# Patient Record
Sex: Female | Born: 1995 | Race: White | Hispanic: No | Marital: Single | Smoking: Never smoker
Health system: Southern US, Community
[De-identification: ages and names within clinical notes are randomized; demographics above are authoritative.]

## PROBLEM LIST (undated history)

## (undated) DIAGNOSIS — J020 Streptococcal pharyngitis: Secondary | ICD-10-CM

## (undated) DIAGNOSIS — R630 Anorexia: Secondary | ICD-10-CM

## (undated) DIAGNOSIS — R109 Unspecified abdominal pain: Secondary | ICD-10-CM

## (undated) DIAGNOSIS — G629 Polyneuropathy, unspecified: Secondary | ICD-10-CM

## (undated) HISTORY — PX: HYMENECTOMY: SHX987

## (undated) HISTORY — DX: Anorexia: R63.0

## (undated) HISTORY — DX: Streptococcal pharyngitis: J02.0

## (undated) HISTORY — PX: TONSILLECTOMY: SUR1361

## (undated) HISTORY — DX: Unspecified abdominal pain: R10.9

---
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
## 2021-02-26 ENCOUNTER — Other Ambulatory Visit: Payer: Self-pay

## 2021-02-26 ENCOUNTER — Ambulatory Visit (INDEPENDENT_AMBULATORY_CARE_PROVIDER_SITE_OTHER): Payer: 59 | Admitting: Internal Medicine

## 2021-02-26 ENCOUNTER — Telehealth: Payer: Self-pay | Admitting: *Deleted

## 2021-02-26 ENCOUNTER — Encounter: Payer: Self-pay | Admitting: Internal Medicine

## 2021-02-26 VITALS — BP 111/76 | HR 90 | Temp 97.1°F | Ht 65.0 in | Wt 139.2 lb

## 2021-02-26 DIAGNOSIS — K5900 Constipation, unspecified: Secondary | ICD-10-CM

## 2021-02-26 DIAGNOSIS — R1032 Left lower quadrant pain: Secondary | ICD-10-CM | POA: Diagnosis not present

## 2021-02-26 MED ORDER — CLENPIQ 10-3.5-12 MG-GM -GM/160ML PO SOLN: 1.0000 | Freq: Once | ORAL | 0 refills | Status: AC

## 2021-02-26 NOTE — H&P (View-Only) (Signed)
  Primary Care Physician:  Howard, Kevin, MD Primary Gastroenterologist:  Dr. Lovelee Forner  Chief Complaint  Patient presents with  . Abdominal Pain    Lower abd pain, constant. CT showed stool consistent with constipation. Reports BM's 1-2 times per day    HPI:   Kristin Fischer is a 24 y.o. female who presents to the clinic today by referral from her PCP Dr. Howard for evaluation.  Patient states approximately 3 weeks ago she had sudden onset severe left lower quadrant abdominal pain.  Rates as a 10 out of 10, nonradiating.  She presented to the ER at [facility] and had a CT scan without contrast which showed constipation, moderate stool burden.  She was sent home on mag citrate.  She states she drank all this and immediately vomited it back up.  She then tried enemas and stool softeners, none of which helped.  Since that time she continues to have left lower quadrant pain every day.  This is worse after she eats meals.  She states her appetite has declined severely.  Has not eaten or drink anything today.  Does note taking fiber in the past as well as drinking plenty water throughout the day.  No melena hematochezia.  No previous colonoscopy.  Does note a great grandparent with colon cancer.  No upper GI symptoms.  Denies any heartburn or reflux.  No dysphagia odynophagia.  No chronic NSAID use.  From a gynecological standpoint, patient states that she previously had severe menstrual cycles but she is now currently taking OCP and this is improved.  She no longer has monthly menstrual periods.  That being said she is being evaluated by gynecology next week for her left lower quadrant pain.  Past Medical History:  Diagnosis Date  . Abdominal pain, recurrent   . Poor appetite   . Strep pharyngitis    several times in the past prompting T&A; last episode 2014    Past Surgical History:  Procedure Laterality Date  . CHOLECYSTECTOMY, LAPAROSCOPIC  04/05/11   No Stones ID'd, EF= 7%  .  TONSILLECTOMY      Current Outpatient Medications  Medication Sig Dispense Refill  . DULoxetine (CYMBALTA) 20 MG capsule TAKE 1 CAPSULE (20 MG TOTAL) BY MOUTH DAILY. (Patient taking differently: Take 60 mg by mouth daily.) 30 capsule 3  . Multiple Vitamin (MULTIVITAMIN) tablet Take 1 tablet by mouth daily.    . NORTREL 0.5/35, 28, 0.5-35 MG-MCG tablet Take 1 tablet by mouth daily.     No current facility-administered medications for this visit.    Allergies as of 02/26/2021 - Review Complete 02/26/2021  Allergen Reaction Noted  . Cephalosporins  06/24/2011  . Lorabid [loracarbef]  06/24/2011  . Penicillins  06/24/2011  . Iohexol Hives 03/31/2011    Family History  Problem Relation Age of Onset  . Cholelithiasis Father     Social History   Socioeconomic History  . Marital status: Single    Spouse name: Not on file  . Number of children: Not on file  . Years of education: Not on file  . Highest education level: Not on file  Occupational History  . Not on file  Tobacco Use  . Smoking status: Never Smoker  . Smokeless tobacco: Never Used  Substance and Sexual Activity  . Alcohol use: Not on file  . Drug use: Not on file  . Sexual activity: Not on file  Other Topics Concern  . Not on file  Social History Narrative  .   Not on file   Social Determinants of Health   Financial Resource Strain: Not on file  Food Insecurity: Not on file  Transportation Needs: Not on file  Physical Activity: Not on file  Stress: Not on file  Social Connections: Not on file  Intimate Partner Violence: Not on file    Subjective: Review of Systems  Constitutional: Negative for chills and fever.  HENT: Negative for congestion and hearing loss.   Eyes: Negative for blurred vision and double vision.  Respiratory: Negative for cough and shortness of breath.   Cardiovascular: Negative for chest pain and palpitations.  Gastrointestinal: Positive for abdominal pain and constipation. Negative  for blood in stool, diarrhea, heartburn, melena and vomiting.  Genitourinary: Negative for dysuria and urgency.  Musculoskeletal: Negative for joint pain and myalgias.  Skin: Negative for itching and rash.  Neurological: Negative for dizziness and headaches.  Psychiatric/Behavioral: Negative for depression. The patient is not nervous/anxious.        Objective: BP 111/76   Pulse 90   Temp (!) 97.1 F (36.2 C)   Ht 5\' 5"  (1.651 m)   Wt 139 lb 3.2 oz (63.1 kg)   BMI 23.16 kg/m  Physical Exam Constitutional:      Appearance: Normal appearance.  HENT:     Head: Normocephalic and atraumatic.  Eyes:     Extraocular Movements: Extraocular movements intact.     Conjunctiva/sclera: Conjunctivae normal.  Cardiovascular:     Rate and Rhythm: Normal rate and regular rhythm.  Pulmonary:     Effort: Pulmonary effort is normal.     Breath sounds: Normal breath sounds.  Abdominal:     General: Bowel sounds are normal.     Palpations: Abdomen is soft.     Tenderness: There is abdominal tenderness in the left upper quadrant.  Musculoskeletal:        General: No swelling. Normal range of motion.     Cervical back: Normal range of motion and neck supple.  Skin:    General: Skin is warm and dry.     Coloration: Skin is not jaundiced.  Neurological:     General: No focal deficit present.     Mental Status: She is alert and oriented to person, place, and time.  Psychiatric:        Mood and Affect: Mood normal.        Behavior: Behavior normal.      Assessment: *Constipation *Left lower quadrant pain  Plan: Etiology of patient's left lower quadrant pain unclear.  We will schedule her for colonoscopy today to further evaluate for underlying phonatory bowel disease such as Crohn's disease ulcerative colitis, polyps, malignancy, stricturing, or other.  If colonoscopy unremarkable, we may need to consider repeat CT imaging with contrast as her CT scan in ER was without contrast looking  for kidney stones.  I am going to give her samples of Linzess 72 mcg daily.  She can increase to 2 tablets daily if she needs to.  Patient to call office if symptoms improved and I will send in a formal prescription.  I agree with gynecological evaluation which she is having done next week.  Further recommendations to follow.  Thank you Dr. for the kind referral  02/26/2021 12:05 PM   Disclaimer: This note was dictated with voice recognition software. Similar sounding words can inadvertently be transcribed and may not be corrected upon review.

## 2021-02-26 NOTE — Patient Instructions (Signed)
We will schedule you for colonoscopy to further evaluate your left lower quadrant abdominal pain.  We may need to consider repeat CT imaging with contrast depending on colonoscopy results.  I am going to give you samples of Linzess 72 mcg daily for chronic constipation.  I would take this medication in the morning.  You may have an initial washout period the first few days but this should improve.  If 72 mcg is not enough, you can take 2 pills every morning and see how you do.  If you would like me to send in a formal prescription for this medication, please give our office a call and I will do so.  I agree with gynecological evaluation as well.   At [facility] we value your feedback. You may receive a survey about your visit today. Please share your experience as we strive to create trusting relationships with our patients to provide genuine, compassionate, quality care.  We appreciate your understanding and patience as we review any laboratory studies, imaging, and other diagnostic tests that are ordered as we care for you. Our office policy is 5 business days for review of these results, and any emergent or urgent results are addressed in a timely manner for your best interest. If you do not hear from our office in 1 week, please contact us.   We also encourage the use of MyChart, which contains your medical information for your review as well. If you are not enrolled in this feature, an access code is on this after visit summary for your convenience. Thank you for allowing [place] to be involved in your care.  It was great to see you today!  I hope you have a great rest of your spring!!    Hennie Duos. Marletta Lor, D.O. Gastroenterology and Hepatology [facility]

## 2021-02-26 NOTE — Telephone Encounter (Signed)
Called pt. She has been scheduled for TCS with propofol, ASA 1, Dr Marletta Lor ASAP on 4/1. Aware will send prep instructions to [facility]. covid test appt scheduled. Rx sent to pharmacy

## 2021-02-26 NOTE — Progress Notes (Signed)
Primary Care Physician:  [name], MD Primary Gastroenterologist:  Dr. Marletta Lor  Chief Complaint  Patient presents with  . Abdominal Pain    Lower abd pain, constant. CT showed stool consistent with constipation. Reports BM's 1-2 times per day    HPI:   Kristin Fischer is a 25 y.o. female who presents to the clinic today by referral from her PCP Dr. Dimas Aguas for evaluation.  Patient states approximately 3 weeks ago she had sudden onset severe left lower quadrant abdominal pain.  Rates as a 10 out of 10, nonradiating.  She presented to the ER at [facility] and had a CT scan without contrast which showed constipation, moderate stool burden.  She was sent home on mag citrate.  She states she drank all this and immediately vomited it back up.  She then tried enemas and stool softeners, none of which helped.  Since that time she continues to have left lower quadrant pain every day.  This is worse after she eats meals.  She states her appetite has declined severely.  Has not eaten or drink anything today.  Does note taking fiber in the past as well as drinking plenty water throughout the day.  No melena hematochezia.  No previous colonoscopy.  Does note a great grandparent with colon cancer.  No upper GI symptoms.  Denies any heartburn or reflux.  No dysphagia odynophagia.  No chronic NSAID use.  From a gynecological standpoint, patient states that she previously had severe menstrual cycles but she is now currently taking OCP and this is improved.  She no longer has monthly menstrual periods.  That being said she is being evaluated by gynecology next week for her left lower quadrant pain.  Past Medical History:  Diagnosis Date  . Abdominal pain, recurrent   . Poor appetite   . Strep pharyngitis    several times in the past prompting T&A; last episode 2014    Past Surgical History:  Procedure Laterality Date  . CHOLECYSTECTOMY, LAPAROSCOPIC  04/05/11   No Stones ID'd, EF= 7%  .  TONSILLECTOMY      Current Outpatient Medications  Medication Sig Dispense Refill  . DULoxetine (CYMBALTA) 20 MG capsule TAKE 1 CAPSULE (20 MG TOTAL) BY MOUTH DAILY. (Patient taking differently: Take 60 mg by mouth daily.) 30 capsule 3  . Multiple Vitamin (MULTIVITAMIN) tablet Take 1 tablet by mouth daily.    Marland Kitchen NORTREL 0.5/35, 28, 0.5-35 MG-MCG tablet Take 1 tablet by mouth daily.     No current facility-administered medications for this visit.    Allergies as of 02/26/2021 - Review Complete 02/26/2021  Allergen Reaction Noted  . Cephalosporins  06/24/2011  . Lorabid [loracarbef]  06/24/2011  . Penicillins  06/24/2011  . Iohexol Hives 03/31/2011    Family History  Problem Relation Age of Onset  . Cholelithiasis Father     Social History   Socioeconomic History  . Marital status: Single    Spouse name: Not on file  . Number of children: Not on file  . Years of education: Not on file  . Highest education level: Not on file  Occupational History  . Not on file  Tobacco Use  . Smoking status: Never Smoker  . Smokeless tobacco: Never Used  Substance and Sexual Activity  . Alcohol use: Not on file  . Drug use: Not on file  . Sexual activity: Not on file  Other Topics Concern  . Not on file  Social History Narrative  .  Not on file   Social Determinants of Health   Financial Resource Strain: Not on file  Food Insecurity: Not on file  Transportation Needs: Not on file  Physical Activity: Not on file  Stress: Not on file  Social Connections: Not on file  Intimate Partner Violence: Not on file    Subjective: Review of Systems  Constitutional: Negative for chills and fever.  HENT: Negative for congestion and hearing loss.   Eyes: Negative for blurred vision and double vision.  Respiratory: Negative for cough and shortness of breath.   Cardiovascular: Negative for chest pain and palpitations.  Gastrointestinal: Positive for abdominal pain and constipation. Negative  for blood in stool, diarrhea, heartburn, melena and vomiting.  Genitourinary: Negative for dysuria and urgency.  Musculoskeletal: Negative for joint pain and myalgias.  Skin: Negative for itching and rash.  Neurological: Negative for dizziness and headaches.  Psychiatric/Behavioral: Negative for depression. The patient is not nervous/anxious.        Objective: BP 111/76   Pulse 90   Temp (!) 97.1 F (36.2 C)   Ht 5\' 5"  (1.651 m)   Wt 139 lb 3.2 oz (63.1 kg)   BMI 23.16 kg/m  Physical Exam Constitutional:      Appearance: Normal appearance.  HENT:     Head: Normocephalic and atraumatic.  Eyes:     Extraocular Movements: Extraocular movements intact.     Conjunctiva/sclera: Conjunctivae normal.  Cardiovascular:     Rate and Rhythm: Normal rate and regular rhythm.  Pulmonary:     Effort: Pulmonary effort is normal.     Breath sounds: Normal breath sounds.  Abdominal:     General: Bowel sounds are normal.     Palpations: Abdomen is soft.     Tenderness: There is abdominal tenderness in the left upper quadrant.  Musculoskeletal:        General: No swelling. Normal range of motion.     Cervical back: Normal range of motion and neck supple.  Skin:    General: Skin is warm and dry.     Coloration: Skin is not jaundiced.  Neurological:     General: No focal deficit present.     Mental Status: She is alert and oriented to person, place, and time.  Psychiatric:        Mood and Affect: Mood normal.        Behavior: Behavior normal.      Assessment: *Constipation *Left lower quadrant pain  Plan: Etiology of patient's left lower quadrant pain unclear.  We will schedule her for colonoscopy today to further evaluate for underlying phonatory bowel disease such as Crohn's disease ulcerative colitis, polyps, malignancy, stricturing, or other.  If colonoscopy unremarkable, we may need to consider repeat CT imaging with contrast as her CT scan in ER was without contrast looking  for kidney stones.  I am going to give her samples of Linzess 72 mcg daily.  She can increase to 2 tablets daily if she needs to.  Patient to call office if symptoms improved and I will send in a formal prescription.  I agree with gynecological evaluation which she is having done next week.  Further recommendations to follow.  Thank you Dr. for the kind referral  02/26/2021 12:05 PM   Disclaimer: This note was dictated with voice recognition software. Similar sounding words can inadvertently be transcribed and may not be corrected upon review.

## 2021-02-26 NOTE — Telephone Encounter (Signed)
PA approved via [facility] website. Auth# [id number] DOS 03/06/21-06/04/21

## 2021-03-04 ENCOUNTER — Other Ambulatory Visit: Payer: Self-pay

## 2021-03-04 ENCOUNTER — Other Ambulatory Visit (HOSPITAL_COMMUNITY): Payer: 59

## 2021-03-04 ENCOUNTER — Other Ambulatory Visit (HOSPITAL_COMMUNITY)
Admission: RE | Admit: 2021-03-04 | Discharge: 2021-03-04 | Disposition: A | Discharge status: Home / Self Care | Payer: 59 | Source: Ambulatory Visit | Attending: Internal Medicine | Admitting: Internal Medicine

## 2021-03-04 DIAGNOSIS — Z01812 Encounter for preprocedural laboratory examination: Secondary | ICD-10-CM | POA: Diagnosis present

## 2021-03-04 DIAGNOSIS — Z20822 Contact with and (suspected) exposure to covid-19: Secondary | ICD-10-CM | POA: Insufficient documentation

## 2021-03-04 LAB — SARS CORONAVIRUS 2 (TAT 6-24 HRS): SARS Coronavirus 2: NEGATIVE

## 2021-03-05 ENCOUNTER — Encounter (HOSPITAL_COMMUNITY): Payer: Self-pay | Admitting: Anesthesiology

## 2021-03-06 ENCOUNTER — Telehealth (INDEPENDENT_AMBULATORY_CARE_PROVIDER_SITE_OTHER): Payer: Self-pay | Admitting: Gastroenterology

## 2021-03-06 ENCOUNTER — Telehealth: Payer: Self-pay

## 2021-03-06 ENCOUNTER — Ambulatory Visit (HOSPITAL_COMMUNITY)
Admission: RE | Admit: 2021-03-06 | Discharge: 2021-03-06 | Disposition: A | Discharge status: Home / Self Care | Payer: 59 | Source: Ambulatory Visit | Attending: Internal Medicine | Admitting: Internal Medicine

## 2021-03-06 ENCOUNTER — Other Ambulatory Visit: Payer: Self-pay

## 2021-03-06 DIAGNOSIS — R1084 Generalized abdominal pain: Secondary | ICD-10-CM | POA: Insufficient documentation

## 2021-03-06 NOTE — Telephone Encounter (Signed)
I called and spoke with patient's mom.  Patient is currently having a lot of abdominal pain and tenderness.  She has yet to have a bowel movement despite completing her entire prep.  I have ordered an x-ray of her abdomen which she states she will have done shortly in any pain.  If she is still constipated we will likely consider MiraLAX purge at that time.

## 2021-03-06 NOTE — Telephone Encounter (Signed)
I spoke with the patient's mother today who reported that the patient did not move her bowels after she had a bowel prep yesterday in preparation for her colonoscopy.  Due to this, she had an x-ray in the morning which she reports of normal bowel gas pattern with some air-fluid levels but no presence of significant colonic dilation.  Findings are nonspecific.  I advised him that since she is having significant abdominal discomfort she should be brought to the ER to have further evaluation with a CT of the abdomen and pelvis with IV contrast.  The mother did not seem to be happy as she stated "had to undergo a CT of the abdomen in the past at [facility] and now an x-ray and no answers have been given for her presentation".  I explained to her that I was able to review the report that did not show any alterations acutely but this study was suboptimal as it was performed without IV contrast.  Unfortunately I am not able to review the image films.  I reviewed the abdominal x-ray and agree with the findings from the radiologist.

## 2021-03-06 NOTE — Telephone Encounter (Signed)
Noted  

## 2021-03-06 NOTE — Telephone Encounter (Signed)
Pt's mother called office, she is scheduled for TCS today at 10:30am. She drank both bottles of Clenpiq, followed instructions and drank extra liquids. She hasn't had a bm since starting prep. C/o nausea. Had a "regular" bm yesterday morning. She described it as being medium, #4 on chart. Took Linzess x 1 dose (about 2 days after OV), it caused bad abdominal pain/cramping so she didn't take anymore.

## 2021-03-12 ENCOUNTER — Telehealth: Payer: Self-pay | Admitting: Internal Medicine

## 2021-03-12 MED ORDER — PEG 3350-KCL-NA BICARB-NACL 420 G PO SOLR: ORAL | 0 refills | Status: DC

## 2021-03-12 NOTE — Telephone Encounter (Signed)
Dr. Marletta Lor please advise thanks

## 2021-03-12 NOTE — Telephone Encounter (Signed)
[phone]  Patient mother Essie Hart called asking when the patient procedure may be rescheduled. Dr. Marletta Lor said that they will try her tcs again with a different prep

## 2021-03-12 NOTE — Addendum Note (Signed)
Addended by: Armstead Peaks on: 03/12/2021 04:34 PM   Modules accepted: Orders

## 2021-03-12 NOTE — Telephone Encounter (Signed)
LMOVM for pt mom to return call

## 2021-03-12 NOTE — Telephone Encounter (Signed)
Pt's mother was returning a call. [phone]

## 2021-03-12 NOTE — Telephone Encounter (Signed)
Please reschedule next available. ASA I  She can try MiraLAX prep this time.  Thank you

## 2021-03-12 NOTE — Telephone Encounter (Signed)
Spoke with pt mom. Pt has been rescheduled to 4/22. Aware will need covid test prior and appt scheduled for 4/20 at 12:30pm. Mom asked for the gallon to be sent in for prep as she has had this herself for TCS and it has worked. RX sent in. Called endo and LMOVM making aware of new appt.

## 2021-03-16 ENCOUNTER — Telehealth: Payer: Self-pay | Admitting: Internal Medicine

## 2021-03-16 NOTE — Telephone Encounter (Signed)
Mother called asking to reschedule procedure date. [phone] 

## 2021-03-16 NOTE — Telephone Encounter (Signed)
Mother called asking to reschedule procedure date. [phone]

## 2021-03-17 NOTE — Telephone Encounter (Signed)
Called mom and they are going to stick with date 4/22 for procedure

## 2021-03-24 ENCOUNTER — Telehealth: Payer: Self-pay | Admitting: *Deleted

## 2021-03-24 NOTE — Telephone Encounter (Signed)
Received call from endo patient needs to arrive for procedure on Friday at 11:!5am.   Called pt and spoke with mom. She is aware. Called Melanie back in endo and made aware.

## 2021-03-25 ENCOUNTER — Other Ambulatory Visit: Payer: Self-pay

## 2021-03-25 ENCOUNTER — Other Ambulatory Visit (HOSPITAL_COMMUNITY)
Admission: RE | Admit: 2021-03-25 | Discharge: 2021-03-25 | Disposition: A | Discharge status: Home / Self Care | Payer: 59 | Source: Ambulatory Visit | Attending: Internal Medicine | Admitting: Internal Medicine

## 2021-03-25 DIAGNOSIS — Z01812 Encounter for preprocedural laboratory examination: Secondary | ICD-10-CM | POA: Insufficient documentation

## 2021-03-25 DIAGNOSIS — Z20822 Contact with and (suspected) exposure to covid-19: Secondary | ICD-10-CM | POA: Diagnosis not present

## 2021-03-25 LAB — RAPID URINE DRUG SCREEN, HOSP PERFORMED
Amphetamines: NOT DETECTED
Barbiturates: NOT DETECTED
Benzodiazepines: NOT DETECTED
Cocaine: NOT DETECTED
Opiates: NOT DETECTED
Tetrahydrocannabinol: NOT DETECTED

## 2021-03-25 LAB — PREGNANCY, URINE: Preg Test, Ur: NEGATIVE

## 2021-03-25 LAB — SARS CORONAVIRUS 2 (TAT 6-24 HRS): SARS Coronavirus 2: NEGATIVE

## 2021-03-27 ENCOUNTER — Ambulatory Visit (HOSPITAL_COMMUNITY): Payer: 59 | Admitting: Anesthesiology

## 2021-03-27 ENCOUNTER — Encounter (HOSPITAL_COMMUNITY): Payer: Self-pay

## 2021-03-27 ENCOUNTER — Encounter (HOSPITAL_COMMUNITY)
Admission: RE | Disposition: A | Discharge status: Home / Self Care | Payer: Self-pay | Source: Home / Self Care | Attending: Internal Medicine

## 2021-03-27 ENCOUNTER — Telehealth: Payer: Self-pay | Admitting: *Deleted

## 2021-03-27 ENCOUNTER — Other Ambulatory Visit: Payer: Self-pay

## 2021-03-27 ENCOUNTER — Ambulatory Visit (HOSPITAL_COMMUNITY)
Admission: RE | Admit: 2021-03-27 | Discharge: 2021-03-27 | Disposition: A | Discharge status: Home / Self Care | Payer: 59 | Attending: Internal Medicine | Admitting: Internal Medicine

## 2021-03-27 DIAGNOSIS — R1032 Left lower quadrant pain: Secondary | ICD-10-CM | POA: Diagnosis present

## 2021-03-27 DIAGNOSIS — Z888 Allergy status to other drugs, medicaments and biological substances status: Secondary | ICD-10-CM | POA: Insufficient documentation

## 2021-03-27 DIAGNOSIS — K648 Other hemorrhoids: Secondary | ICD-10-CM | POA: Insufficient documentation

## 2021-03-27 DIAGNOSIS — Z800 Family history of malignant neoplasm of digestive organs: Secondary | ICD-10-CM | POA: Insufficient documentation

## 2021-03-27 DIAGNOSIS — Z79899 Other long term (current) drug therapy: Secondary | ICD-10-CM | POA: Diagnosis not present

## 2021-03-27 DIAGNOSIS — K5900 Constipation, unspecified: Secondary | ICD-10-CM | POA: Insufficient documentation

## 2021-03-27 DIAGNOSIS — Z9049 Acquired absence of other specified parts of digestive tract: Secondary | ICD-10-CM | POA: Diagnosis not present

## 2021-03-27 DIAGNOSIS — Z881 Allergy status to other antibiotic agents status: Secondary | ICD-10-CM | POA: Diagnosis not present

## 2021-03-27 DIAGNOSIS — Z880 Allergy status to penicillin: Secondary | ICD-10-CM | POA: Diagnosis not present

## 2021-03-27 DIAGNOSIS — Z8379 Family history of other diseases of the digestive system: Secondary | ICD-10-CM | POA: Diagnosis not present

## 2021-03-27 HISTORY — PX: COLONOSCOPY WITH PROPOFOL: SHX5780

## 2021-03-27 SURGERY — COLONOSCOPY WITH PROPOFOL
Anesthesia: General

## 2021-03-27 MED ORDER — PROPOFOL 10 MG/ML IV BOLUS
INTRAVENOUS | Status: DC | PRN
  Administered 2021-03-27: 50 mg via INTRAVENOUS
  Administered 2021-03-27 (×2): 100 mg via INTRAVENOUS

## 2021-03-27 MED ORDER — STERILE WATER FOR IRRIGATION IR SOLN
Status: DC | PRN
  Administered 2021-03-27: 1.5 mL

## 2021-03-27 MED ORDER — PROPOFOL 500 MG/50ML IV EMUL
INTRAVENOUS | Status: DC | PRN
  Administered 2021-03-27: 200 ug/kg/min via INTRAVENOUS

## 2021-03-27 MED ORDER — LACTATED RINGERS IV SOLN: INTRAVENOUS | Status: DC

## 2021-03-27 NOTE — Telephone Encounter (Signed)
Called patient mom and made aware Dr. Marletta Lor recs contrast barium enema study. Advised of instructions but patient is refusing as she would have to drink 1 bottle of mag citrate and states this made her very sick last time.  She now states the patient is having BM brown w/ clear. They did not want to cancel procedure just yet and states they would call around 10 if they decide to cancel. I did advise if patient is not cleaned out they can't proceed with procedure. Patient mother states she can't reason with patient. They are afraid with patient not eating and has hx of anorexia it will take patient into a dark place. I have sent Dr. Marletta Lor a message making aware and called endo (spoke with [facility]) made aware.

## 2021-03-27 NOTE — Interval H&P Note (Signed)
History and Physical Interval Note:  03/27/2021 12:11 PM  Kristin Fischer  has presented today for surgery, with the diagnosis of lower abd pain.  The various methods of treatment have been discussed with the patient and family. After consideration of risks, benefits and other options for treatment, the patient has consented to  Procedure(s) with comments: COLONOSCOPY WITH PROPOFOL (N/A) - pm appt. needs preg test in pre-op as a surgical intervention.  The patient's history has been reviewed, patient examined, no change in status, stable for surgery.  I have reviewed the patient's chart and labs.  Questions were answered to the patient's satisfaction.     [name]

## 2021-03-27 NOTE — Discharge Instructions (Addendum)
  Colonoscopy Discharge Instructions  Read the instructions outlined below and refer to this sheet in the next few weeks. These discharge instructions provide you with general information on caring for yourself after you leave the hospital. Your doctor may also give you specific instructions. While your treatment has been planned according to the most current medical practices available, unavoidable complications occasionally occur.   ACTIVITY  You may resume your regular activity, but move at a slower pace for the next 24 hours.   Take frequent rest periods for the next 24 hours.   Walking will help get rid of the air and reduce the bloated feeling in your belly (abdomen).   No driving for 24 hours (because of the medicine (anesthesia) used during the test).    Do not sign any important legal documents or operate any machinery for 24 hours (because of the anesthesia used during the test).  NUTRITION  Drink plenty of fluids.   You may resume your normal diet as instructed by your doctor.   Begin with a light meal and progress to your normal diet. Heavy or fried foods are harder to digest and may make you feel sick to your stomach (nauseated).   Avoid alcoholic beverages for 24 hours or as instructed.  MEDICATIONS  You may resume your normal medications unless your doctor tells you otherwise.  WHAT YOU CAN EXPECT TODAY  Some feelings of bloating in the abdomen.   Passage of more gas than usual.   Spotting of blood in your stool or on the toilet paper.  IF YOU HAD POLYPS REMOVED DURING THE COLONOSCOPY:  No aspirin products for 7 days or as instructed.   No alcohol for 7 days or as instructed.   Eat a soft diet for the next 24 hours.  FINDING OUT THE RESULTS OF YOUR TEST Not all test results are available during your visit. If your test results are not back during the visit, make an appointment with your caregiver to find out the results. Do not assume everything is normal if  you have not heard from your caregiver or the medical facility. It is important for you to follow up on all of your test results.  SEEK IMMEDIATE MEDICAL ATTENTION IF:  You have more than a spotting of blood in your stool.   Your belly is swollen (abdominal distention).   You are nauseated or vomiting.   You have a temperature over 101.   You have abdominal pain or discomfort that is severe or gets worse throughout the day.   Your colonoscopy was relatively unremarkable.  I did not find any polyps or evidence of colon cancer.  I did not find any inflammation indicative of underlying inflammatory bowel disease such as Crohn's disease or ulcerative colitis.  Recommend repeating colonoscopy at age 67 for colon cancer screening purposes.  Would recommend that you follow back up with your gynecologist to discuss potential work-up for endometriosis.  Follow-up with GI in 2 to 3 months.  I hope you have a great rest of your week!  Kristin Fischer. Marletta Lor, D.O. Gastroenterology and Hepatology [facility]

## 2021-03-27 NOTE — Anesthesia Preprocedure Evaluation (Signed)
Anesthesia Evaluation  Patient identified by MRN, date of birth, ID band Patient awake    Reviewed: Allergy & Precautions, H&P , NPO status , Patient's Chart, lab work & pertinent test results, reviewed documented beta blocker date and time   Airway Mallampati: II  TM Distance: >3 FB Neck ROM: full    Dental no notable dental hx.    Pulmonary neg pulmonary ROS,    Pulmonary exam normal breath sounds clear to auscultation       Cardiovascular Exercise Tolerance: Good negative cardio ROS   Rhythm:regular Rate:Normal     Neuro/Psych PSYCHIATRIC DISORDERS Anxiety negative neurological ROS     GI/Hepatic negative GI ROS, Neg liver ROS,   Endo/Other  negative endocrine ROS  Renal/GU negative Renal ROS  negative genitourinary   Musculoskeletal   Abdominal   Peds  Hematology negative hematology ROS (+)   Anesthesia Other Findings   Reproductive/Obstetrics negative OB ROS                             Anesthesia Physical Anesthesia Plan  ASA: II  Anesthesia Plan: General   Post-op Pain Management:    Induction:   PONV Risk Score and Plan: Propofol infusion  Airway Management Planned:   Additional Equipment:   Intra-op Plan:   Post-operative Plan:   Informed Consent: I have reviewed the patients History and Physical, chart, labs and discussed the procedure including the risks, benefits and alternatives for the proposed anesthesia with the patient or authorized representative who has indicated his/her understanding and acceptance.     Dental Advisory Given  Plan Discussed with: CRNA  Anesthesia Plan Comments:         Anesthesia Quick Evaluation  

## 2021-03-27 NOTE — Anesthesia Postprocedure Evaluation (Signed)
Anesthesia Post Note  Patient: Kristin Fischer  Procedure(s) Performed: COLONOSCOPY WITH PROPOFOL (N/A )  Patient location during evaluation: Phase II Anesthesia Type: General Level of consciousness: awake Pain management: pain level controlled Vital Signs Assessment: post-procedure vital signs reviewed and stable Respiratory status: spontaneous breathing and respiratory function stable Cardiovascular status: blood pressure returned to baseline and stable Postop Assessment: no headache and no apparent nausea or vomiting Anesthetic complications: no Comments: Late entry   No complications documented.   Last Vitals:  Vitals:   03/27/21 1130 03/27/21 1322  BP: (!) 128/94 99/72  Pulse: 76   Resp: 19 16  Temp: 36.9 C 36.5 C  SpO2: 100% 100%    Last Pain:  Vitals:   03/27/21 1322  TempSrc: Oral  PainSc: 0-No pain                 Windell Norfolk

## 2021-03-27 NOTE — Telephone Encounter (Signed)
Patient mother called back and they are going to attempt pt to have TCS done.

## 2021-03-27 NOTE — Op Note (Signed)
[facility] Patient Name: Kristin Fischer Procedure Date: 03/27/2021 11:32 AM MRN: [id number] Date of Birth: [birth year] Attending MD: Elon Alas. Abbey Chatters DO CSN: [id number] Age: 25 Admit Type: Outpatient Procedure:                Colonoscopy Indications:              Abdominal pain in the left lower quadrant Providers:                Elon Alas. Abbey Chatters, DO, Charlsie Quest. Theda Sers RN, RN,                            Aram Candela Referring MD:              Medicines:                See the Anesthesia note for documentation of the                            administered medications Complications:            No immediate complications. Estimated Blood Loss:     Estimated blood loss was minimal. Estimated blood                            loss: none. Procedure:                Pre-Anesthesia Assessment:                           - The anesthesia plan was to use monitored                            anesthesia care (MAC).                           After obtaining informed consent, the colonoscope                            was passed under direct vision. Throughout the                            procedure, the patient's blood pressure, pulse, and                            oxygen saturations were monitored continuously. The                            PCF-[id number] ([id number]) was introduced through the                            anus and advanced to the the cecum, identified by                            appendiceal orifice and ileocecal valve. The                            colonoscopy was performed without difficulty. The  patient tolerated the procedure well. The quality                            of the bowel preparation was evaluated using the                            BBPS [facility] Bowel Preparation Scale) with scores                            of: Right Colon = 2 (minor amount of residual                            staining, small fragments of stool and/or opaque                             liquid, but mucosa seen well), Transverse Colon = 2                            (minor amount of residual staining, small fragments                            of stool and/or opaque liquid, but mucosa seen                            well) and Left Colon = 2 (minor amount of residual                            staining, small fragments of stool and/or opaque                            liquid, but mucosa seen well). The total BBPS score                            equals 6. The quality of the bowel preparation was                            fair. Scope In: 94:85:46 PM Scope Out: 1:19:18 PM Scope Withdrawal Time: 0 hours 6 minutes 59 seconds  Total Procedure Duration: 0 hours 21 minutes 0 seconds  Findings:      The perianal and digital rectal examinations were normal.      Non-bleeding internal hemorrhoids were found during endoscopy.      The terminal ileum appeared normal.      A large amount of stool was found in the entire colon, making       visualization difficult. Lavage of the area was performed using copious       amounts of sterile water, resulting in clearance with fair visualization. Impression:               - Preparation of the colon was fair.                           - Non-bleeding internal hemorrhoids.                           -  The examined portion of the ileum was normal.                           - Stool in the entire examined colon.                           - No specimens collected. Moderate Sedation:      Per Anesthesia Care Recommendation:           - Patient has a contact number available for                            emergencies. The signs and symptoms of potential                            delayed complications were discussed with the                            patient. Return to normal activities tomorrow.                            Written discharge instructions were provided to the                            patient.                            - Resume previous diet.                           - Continue present medications.                           - Repeat colonoscopy at age 14 or sooner if higher                            risk for screening purposes.                           - Return to GI clinic in 6 weeks. Procedure Code(s):        --- Professional ---                           [id number], Colonoscopy, flexible; diagnostic, including                            collection of specimen(s) by brushing or washing,                            when performed (separate procedure) Diagnosis Code(s):        --- Professional ---                           F75.1, Other hemorrhoids                           R10.32,  Left lower quadrant pain CPT copyright 2019 American Medical Association. All rights reserved. The codes documented in this report are preliminary and upon coder review may  be revised to meet current compliance requirements. Elon Alas. Abbey Chatters, DO [place] Abbey Chatters, DO 03/27/2021 1:31:02 PM This report has been signed electronically. Number of Addenda: 0

## 2021-03-27 NOTE — Transfer of Care (Signed)
Immediate Anesthesia Transfer of Care Note  Patient: Kristin Fischer  Procedure(s) Performed: COLONOSCOPY WITH PROPOFOL (N/A )  Patient Location: PACU  Anesthesia Type:General  Level of Consciousness: awake and alert   Airway & Oxygen Therapy: Patient Spontanous Breathing  Post-op Assessment: Report given to RN and Post -op Vital signs reviewed and stable  Post vital signs: Reviewed and stable  Last Vitals:  Vitals Value Taken Time  BP    Temp    Pulse    Resp    SpO2      Last Pain:  Vitals:   03/27/21 1255  TempSrc:   PainSc: 0-No pain      Patients Stated Pain Goal: 2 (03/27/21 1130)  Complications: No complications documented.

## 2021-03-27 NOTE — Telephone Encounter (Signed)
Received call from patient mother. She is on for procedure today. Patient started trilite as instructed yesterday. Patient consumed a little over half the jug. She had to strain and had very little BM. This morning had very little BM. Unable to finish remaining prep as it has made her feel sick. Mother is concerned patient may have blockage or something else going on. 2nd attempt to prep for procedure. Please advise Dr. Marletta Lor.

## 2021-04-01 ENCOUNTER — Encounter (HOSPITAL_COMMUNITY): Payer: Self-pay | Admitting: Internal Medicine

## 2021-05-13 ENCOUNTER — Ambulatory Visit: Payer: 59 | Admitting: Internal Medicine

## 2021-10-19 ENCOUNTER — Ambulatory Visit: Payer: 59 | Admitting: Internal Medicine

## 2021-11-03 ENCOUNTER — Ambulatory Visit: Payer: 59 | Admitting: Internal Medicine

## 2022-01-08 ENCOUNTER — Emergency Department (HOSPITAL_COMMUNITY): Payer: 59

## 2022-01-08 ENCOUNTER — Encounter: Payer: Self-pay | Admitting: Emergency Medicine

## 2022-01-08 ENCOUNTER — Emergency Department (HOSPITAL_COMMUNITY)
Admission: EM | Admit: 2022-01-08 | Discharge: 2022-01-08 | Disposition: A | Discharge status: Home / Self Care | Payer: 59 | Attending: Emergency Medicine | Admitting: Emergency Medicine

## 2022-01-08 ENCOUNTER — Ambulatory Visit
Admission: EM | Admit: 2022-01-08 | Discharge: 2022-01-08 | Disposition: A | Discharge status: Home / Self Care | Payer: 59

## 2022-01-08 ENCOUNTER — Other Ambulatory Visit: Payer: Self-pay

## 2022-01-08 DIAGNOSIS — R002 Palpitations: Secondary | ICD-10-CM | POA: Diagnosis not present

## 2022-01-08 DIAGNOSIS — M7989 Other specified soft tissue disorders: Secondary | ICD-10-CM | POA: Insufficient documentation

## 2022-01-08 DIAGNOSIS — M79662 Pain in left lower leg: Secondary | ICD-10-CM

## 2022-01-08 DIAGNOSIS — F411 Generalized anxiety disorder: Secondary | ICD-10-CM

## 2022-01-08 HISTORY — DX: Polyneuropathy, unspecified: G62.9

## 2022-01-08 NOTE — ED Provider Notes (Signed)
Emergency Department Provider Note   I have reviewed the triage vital signs and the nursing notes.   HISTORY  Chief Complaint No chief complaint on file.   HPI Kristin Fischer is a 26 y.o. female presents to the ED from [facility] for evaluation of left leg pain and swelling. No imaging PTA. No prior history of DVT/PE. No CP or SOB. Notes some chronic rash/pain in the right leg but no clear diagnosis after multiple evaluations for tick borne illness and rheumatology evaluation. Sent by UC for evaluation of DVT with [place].    Past Medical History:  Diagnosis Date   Abdominal pain, recurrent    Neuropathy    Poor appetite    Strep pharyngitis    several times in the past prompting T&A; last episode 2014    Review of Systems  Constitutional: No fever/chills Cardiovascular: Denies chest pain. Respiratory: Denies shortness of breath. Gastrointestinal: No abdominal pain.   Musculoskeletal: Positive left leg pain.  Skin: Chronic rash to right leg.  Neurological: Negative for headaches.   ____________________________________________   PHYSICAL EXAM:  VITAL SIGNS: ED Triage Vitals  Enc Vitals Group     BP 01/08/22 2011 129/87     Pulse Rate 01/08/22 2011 90     Resp 01/08/22 2011 17     Temp 01/08/22 2011 98.2 F (36.8 C)     Temp Source 01/08/22 2011 Oral     SpO2 01/08/22 2011 100 %   Constitutional: Alert and oriented. Well appearing and in no acute distress. Eyes: Conjunctivae are normal.  Head: Atraumatic. Nose: No congestion/rhinnorhea. Mouth/Throat: Mucous membranes are moist.  Neck: No stridor.   Cardiovascular: Good peripheral circulation. 2 + DP pulses on the left.  Respiratory: Normal respiratory effort.  Gastrointestinal: No distention.  Musculoskeletal: Mild left calf tenderness. Mild swelling noted. No erythema or warmth. No joint swelling/warmth.  Neurologic:  Normal speech and language. Normal sensation in the left leg.  Skin:  Skin is warm, dry and  intact. No rash noted.  ____________________________________________   PROCEDURES  Procedure(s) performed:   Procedures  None ____________________________________________   INITIAL IMPRESSION / ASSESSMENT AND PLAN / ED COURSE  Pertinent labs & imaging results that were available during my care of the patient were reviewed by me and considered in my medical decision making (see chart for details).   This patient is Presenting for Evaluation of leg pain, which does require a range of treatment options, and is a complaint that involves a high risk of morbidity and mortality.  The Differential Diagnoses include DVT, Cellulitis, MSK strain, septic joint, critical limb ischemia.  I did obtain Additional Historical Information from Mom at bedside.  I decided to review pertinent External Data, and in summary patient referred here from [facility] for DVT.   Radiologic Tests: Considered plain films to evaluated for bony injury but patient refused. DVT US not available at this time but scheduled for 9AM tomorrow morning.   Consult complete with Radiology to schedule [place] in the AM.   Medical Decision Making: Summary:  Patient sent for DVT US but that study is not available at this time. Patient refusing plain films. Suspect MSK strain clinically. Plan for return in the AM for [place] which was scheduled to r/o DVT. Advised patient continue to f/u with PCP regarding migratory rash and joint pain on the right.   Disposition: discharge  ____________________________________________  FINAL CLINICAL IMPRESSION(S) / ED DIAGNOSES  Final diagnoses:  Pain of left calf  Note:  This document was prepared using Dragon voice recognition software and may include unintentional dictation errors.  Nanda Quinton, MD, [facility]    Porshia Blizzard, W[facility], MD 01/10/22 1228

## 2022-01-08 NOTE — ED Notes (Signed)
Pt doesn't want xray and requests appt for [place]- Dr Jacqulyn Bath made aware

## 2022-01-08 NOTE — ED Provider Notes (Signed)
[facility]    CSN: [id number] Arrival date & time: 01/08/22  1830      History   Chief Complaint Chief Complaint  Patient presents with   Leg Pain    HPI Kristin Fischer is a 26 y.o. female.   Patient presenting today with sudden onset severe sharp left calf pain that began last night and has continued onto today.  She states the area is intermittently swollen, red as well.  She denies any injury to the area, fever, chills, chest pain, shortness of breath but does feel that she is having some palpitations which she is unsure if that is related to her anxiety or not.  Has not tried anything over-the-counter for pain thus far.  She is able to bear weight but walking worsens the pain.  She denies any history of DVT or coagulation disorders, long sedentary periods of travel, smoking history but is on oral contraceptives.   Past Medical History:  Diagnosis Date   Abdominal pain, recurrent    Neuropathy    Poor appetite    Strep pharyngitis    several times in the past prompting T&A; last episode 2014    Patient Active Problem List   Diagnosis Date Noted   Adjustment disorder with mixed anxiety and depressed mood 11/06/2014   Acne vulgaris 08/04/2014   Anorexia nervosa, restricting type 05/07/2014   Sleep difficulties 05/07/2014    Past Surgical History:  Procedure Laterality Date   CHOLECYSTECTOMY, LAPAROSCOPIC  04/05/11   No Stones ID'd, EF= 7%   COLONOSCOPY WITH PROPOFOL N/A 03/27/2021   Procedure: COLONOSCOPY WITH PROPOFOL;  Surgeon: Eloise Harman, DO;  Location: [facility];  Service: Endoscopy;  Laterality: N/A;  pm appt. needs preg test in pre-op   TONSILLECTOMY      OB History   No obstetric history on file.      Home Medications    Prior to Admission medications   Medication Sig Start Date End Date Taking? Authorizing Provider  COLLAGEN PO Take 1 capsule by mouth daily.    [provider]  DULoxetine (CYMBALTA) 60 MG capsule Take  60 mg by mouth daily. 01/19/21   [provider]  ibuprofen (ADVIL) 200 MG tablet Take 400 mg by mouth every 6 (six) hours as needed for headache or moderate pain.    [provider]  NORTREL 0.5/35, 28, 0.5-35 MG-MCG tablet Take 1 tablet by mouth daily. 01/16/21   [provider]  Pediatric Multivitamins-Iron (FLINTSTONES COMPLETE PO) Take 2 tablets by mouth daily.    [provider]  polyethylene glycol-electrolytes (NULYTELY) 420 g solution As directed 03/12/21   Eloise Harman, DO    Family History Family History  Problem Relation Age of Onset   Cholelithiasis Father     Social History Social History   Tobacco Use   Smoking status: Never   Smokeless tobacco: Never  Vaping Use   Vaping Use: Never used  Substance Use Topics   Alcohol use: Not Currently    Alcohol/week: 0.0 standard drinks   Drug use: Not Currently     Allergies   Cephalosporins, Iohexol, Lorabid [loracarbef], and Penicillins   Review of Systems Review of Systems Per HPI  Physical Exam Triage Vital Signs ED Triage Vitals [01/08/22 1921]  Enc Vitals Group     BP 123/87     Pulse Rate 87     Resp 18     Temp 98.2 F (36.8 C)  Temp Source Oral     SpO2 98 %     Weight 150 lb (68 kg)     Height 5\' 6"  (1.676 m)     Head Circumference      Peak Flow      Pain Score 3     Pain Loc      Pain Edu?      Excl. in [place]?    No data found.  Updated Vital Signs BP 123/87 (BP Location: Right Arm)    Pulse 87    Temp 98.2 F (36.8 C) (Oral)    Resp 18    Ht 5\' 6"  (1.676 m)    Wt 150 lb (68 kg)    LMP 01/03/2022 (Approximate)    SpO2 98%    BMI 24.21 kg/m   Visual Acuity Right Eye Distance:   Left Eye Distance:   Bilateral Distance:    Right Eye Near:   Left Eye Near:    Bilateral Near:     Physical Exam Vitals and nursing note reviewed.  Constitutional:      Appearance: Normal appearance. She is not ill-appearing.  HENT:     Head: Atraumatic.  Eyes:      Extraocular Movements: Extraocular movements intact.     Conjunctiva/sclera: Conjunctivae normal.  Cardiovascular:     Rate and Rhythm: Normal rate and regular rhythm.     Heart sounds: Normal heart sounds.  Pulmonary:     Effort: Pulmonary effort is normal.     Breath sounds: Normal breath sounds.  Musculoskeletal:        General: Swelling and tenderness present. Normal range of motion.     Cervical back: Normal range of motion and neck supple.     Comments: Trace edema noted centrally posterior left lower leg near area of pain.  Localized area of tenderness to palpation posterior left lower leg.  Mildly positive Homans' sign.  Skin:    General: Skin is warm and dry.     Findings: No erythema.  Neurological:     Mental Status: She is alert and oriented to person, place, and time.     Comments: Left lower extremity neurovascularly intact  Psychiatric:        Mood and Affect: Mood normal.        Thought Content: Thought content normal.        Judgment: Judgment normal.     UC Treatments / Results  Labs (all labs ordered are listed, but only abnormal results are displayed) Labs Reviewed - No data to display  EKG   Radiology No results found.  Procedures Procedures (including critical care time)  Medications Ordered in UC Medications - No data to display  Initial Impression / Assessment and Plan / UC Course  I have reviewed the triage vital signs and the nursing notes.  Pertinent labs & imaging results that were available during my care of the patient were reviewed by me and considered in my medical decision making (see chart for details).     Discussed with patient that we are unable to rule out a DVT at this time and the safest option is for her to go to the emergency department where she can have rule out imaging performed right away.  While this may be a muscular strain, patient is agreeable to going to rule out possible emergent cause of her symptoms.  Her mom who  is with her will drive her via private vehicle to the emergency department for  further evaluation and rule out.  Final Clinical Impressions(s) / UC Diagnoses   Final diagnoses:  Pain of left calf  Leg swelling  Palpitations  Anxiety state   Discharge Instructions   None    ED Prescriptions   None    PDMP not reviewed this encounter.   Volney American, [place] 01/08/22 1958
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
## 2022-01-08 NOTE — Discharge Instructions (Signed)
IMPORTANT PATIENT INSTRUCTIONS:  Your ED provider has recommended an Outpatient Ultrasound.  Please call [phone] to schedule an appointment.  If your appointment is scheduled for a Saturday, Sunday or holiday, please go to the [facility] Registration Desk at least 15 minutes prior to your appointment time and tell them you are there for an ultrasound.    If your appointment is scheduled for a weekday (Monday-Friday), please go directly to the [facility] at least 15 minutes prior to your appointment time and tell them you are there for an ultrasound.  Please call [phone] with questions. 

## 2022-01-08 NOTE — ED Triage Notes (Signed)
Sent from [facility] for a left leg [place]. See UC note on 2/3 at 7:20pm

## 2022-01-09 ENCOUNTER — Ambulatory Visit (HOSPITAL_COMMUNITY)
Admission: RE | Admit: 2022-01-09 | Discharge: 2022-01-09 | Disposition: A | Discharge status: Home / Self Care | Payer: 59 | Source: Ambulatory Visit | Attending: Emergency Medicine | Admitting: Emergency Medicine

## 2022-01-09 DIAGNOSIS — M79662 Pain in left lower leg: Secondary | ICD-10-CM | POA: Insufficient documentation

## 2023-01-13 LAB — CYTOLOGY - PAP: Pap Smear: NORMAL

## 2023-08-22 ENCOUNTER — Ambulatory Visit: Payer: 59 | Admitting: Adult Health

## 2023-08-22 ENCOUNTER — Encounter: Payer: Self-pay | Admitting: Adult Health

## 2023-08-22 VITALS — BP 115/80 | HR 80 | Ht 65.5 in | Wt 151.0 lb

## 2023-08-22 DIAGNOSIS — R140 Abdominal distension (gaseous): Secondary | ICD-10-CM | POA: Insufficient documentation

## 2023-08-22 DIAGNOSIS — N941 Unspecified dyspareunia: Secondary | ICD-10-CM

## 2023-08-22 DIAGNOSIS — Z1330 Encounter for screening examination for mental health and behavioral disorders, unspecified: Secondary | ICD-10-CM

## 2023-08-22 DIAGNOSIS — K5900 Constipation, unspecified: Secondary | ICD-10-CM

## 2023-08-22 DIAGNOSIS — N920 Excessive and frequent menstruation with regular cycle: Secondary | ICD-10-CM

## 2023-08-22 DIAGNOSIS — R5383 Other fatigue: Secondary | ICD-10-CM | POA: Diagnosis not present

## 2023-08-22 DIAGNOSIS — N888 Other specified noninflammatory disorders of cervix uteri: Secondary | ICD-10-CM

## 2023-08-22 DIAGNOSIS — R102 Pelvic and perineal pain: Secondary | ICD-10-CM | POA: Diagnosis not present

## 2023-08-22 MED ORDER — METRONIDAZOLE 0.75 % VA GEL: 1.0000 | Freq: Every day | VAGINAL | 0 refills | Status: AC

## 2023-08-22 NOTE — Progress Notes (Addendum)
Subjective:     Patient ID: Kristin Fischer, female   DOB: 1996/09/20, 27 y.o.   MRN: [id number]  HPI Kristin Fischer is a 27 year old white female,single, G0P0, in complaining of pelvic pain for years, and has bloating. She also says sex hurts in all positions. The pain comes and goes. She has chronic constipation but has regular bowel movements. She is on BCP continuously and had spotting at times. She has rash right hip area, that is chronic to see rheumatologist in February, has seen dermatologist.  She wonders if endometriosis,she is on BCP.   Last pap was negative 01/13/23.  PCP is Bunnie Pion PA at [facility].   Review of Systems +pelvic pain for years, it is on and off and stabbing    +bloating Pain with sex +chronic constipation +spotting +tired Rash right hip area for a while has seen dermatologist and see rheumatologist  Reviewed past medical,surgical, social and family history. Reviewed medications and allergies.  Objective:   Physical Exam BP 115/80 (BP Location: Left Arm, Patient Position: Sitting, Cuff Size: Normal)   Pulse 80   Ht 5' 5.5" (1.664 m)   Wt 151 lb (68.5 kg)   BMI 24.75 kg/m     Skin warm and dry.  Lungs: clear to ausculation bilaterally. Cardiovascular: regular rate and rhythm. Abdomen is soft and non tender, no HSM noted and has BS x 4 Has irregular rash right hip area Pelvic: external genitalia is normal in appearance no lesions, vagina: pink,urethra has no lesions or masses noted, cervix:is nulliparous and friable, uterus: normal size, shape and contour, mildly tender, no masses felt, adnexa: no masses, + tenderness noted bilaterally. Bladder is non tender and no masses felt.  AA is 0 Fall risk is low    08/22/2023    9:55 AM 05/07/2014    4:51 PM  Depression screen PHQ 2/9  Decreased Interest 2 0  Down, Depressed, Hopeless 2 0  PHQ - 2 Score 4 0  Altered sleeping 3 3  Tired, decreased energy 3 3  Change in appetite 0 1  Feeling bad or failure about  yourself  0 1  Trouble concentrating 3 1  Moving slowly or fidgety/restless 0 0  Suicidal thoughts 0 0  PHQ-9 Score 13 9       08/22/2023    9:55 AM  GAD 7 : Generalized Anxiety Score  Nervous, Anxious, on Edge 3  Control/stop worrying 3  Worry too much - different things 3  Trouble relaxing 2  Restless 3  Easily annoyed or irritable 1  Afraid - awful might happen 3  Total GAD 7 Score 18    Upstream - 08/22/23 1004       Pregnancy Intention Screening   Does the patient want to become pregnant in the next year? No    Does the patient's partner want to become pregnant in the next year? No    Would the patient like to discuss contraceptive options today? No      Contraception Wrap Up   Current Method Oral Contraceptive    End Method Oral Contraceptive    Contraception Counseling Provided Yes            Examination chaperoned by Malachy Mood LPN  Burman Foster NP student in during exam.    Assessment:     1. Pelvic pain Has pain on and off for years It is stabbing Will get [place] at [facility] 08/30/23 at 9:30 am to assess uterus and ovaries  -  US PELVIC COMPLETE WITH TRANSVAGINAL; Future  2. Bloating + bloating, getting [place] - US PELVIC COMPLETE WITH TRANSVAGINAL; Future  3. Dyspareunia in female Has pain with sex - US PELVIC COMPLETE WITH TRANSVAGINAL; Future  4. Tired   5. Friable cervix +friable cervix will rx Metrogel and recheck in 2 weeks  Meds ordered this encounter  Medications   metroNIDAZOLE (METROGEL) 0.75 % vaginal gel    Sig: Place 1 Applicatorful vaginally at bedtime.    Dispense:  70 g    Refill:  0    Order Specific Question:   Supervising Provider    Answer:   Despina Hidden, LUTHER H [[id number]]     6. Spotting Will try metrogel   7. Constipation, unspecified constipation type Having regular BM now     Plan:     Follow up in 2 weeks for ROS and recheck cervix

## 2023-08-30 ENCOUNTER — Encounter (HOSPITAL_COMMUNITY): Payer: Self-pay

## 2023-08-30 ENCOUNTER — Ambulatory Visit (HOSPITAL_COMMUNITY): Payer: 59

## 2023-09-05 ENCOUNTER — Ambulatory Visit: Payer: 59 | Admitting: Adult Health

## 2023-09-12 ENCOUNTER — Ambulatory Visit (HOSPITAL_COMMUNITY)
Admission: RE | Admit: 2023-09-12 | Discharge: 2023-09-12 | Disposition: A | Discharge status: Home / Self Care | Payer: 59 | Source: Ambulatory Visit | Attending: Adult Health | Admitting: Adult Health

## 2023-09-12 DIAGNOSIS — N941 Unspecified dyspareunia: Secondary | ICD-10-CM | POA: Diagnosis present

## 2023-09-12 DIAGNOSIS — R140 Abdominal distension (gaseous): Secondary | ICD-10-CM | POA: Diagnosis present

## 2023-09-12 DIAGNOSIS — R102 Pelvic and perineal pain: Secondary | ICD-10-CM | POA: Insufficient documentation

## 2023-09-19 ENCOUNTER — Encounter: Payer: Self-pay | Admitting: Adult Health

## 2023-09-19 ENCOUNTER — Ambulatory Visit: Payer: 59 | Admitting: Adult Health

## 2023-09-19 VITALS — BP 96/71 | HR 81 | Ht 65.5 in | Wt 155.5 lb

## 2023-09-19 DIAGNOSIS — R102 Pelvic and perineal pain: Secondary | ICD-10-CM

## 2023-09-19 DIAGNOSIS — R140 Abdominal distension (gaseous): Secondary | ICD-10-CM

## 2023-09-19 DIAGNOSIS — N941 Unspecified dyspareunia: Secondary | ICD-10-CM

## 2023-09-19 NOTE — Progress Notes (Signed)
  Subjective:     Patient ID: Kristin Fischer, female   DOB: 1996-03-11, 27 y.o.   MRN: [id number]  HPI Kristin Fischer is a 27 year old white female,single, G0P0, back in follow up on having [place] last week for pelvic pain for years and bloating and pain with sex. The [place] was normal.   Last pap was normal 01/13/23  PCP is Bunnie Pion PA.  Review of Systems +pelvic pain fir years +bloating +pain with sex   Reviewed past medical,surgical, social and family history. Reviewed medications and allergies.  Objective:   Physical Exam BP 96/71 (BP Location: Left Arm, Patient Position: Sitting, Cuff Size: Normal)   Pulse 81   Ht 5' 5.5" (1.664 m)   Wt 155 lb 8 oz (70.5 kg)   BMI 25.48 kg/m     Skin warm and dry.  Lungs: clear to ausculation bilaterally. Cardiovascular: regular rate and rhythm.   Upstream - 09/19/23 0856       Pregnancy Intention Screening   Does the patient want to become pregnant in the next year? No    Does the patient's partner want to become pregnant in the next year? No    Would the patient like to discuss contraceptive options today? No      Contraception Wrap Up   Current Method Oral Contraceptive    End Method Oral Contraceptive    Contraception Counseling Provided Yes             Assessment:     1. Pelvic pain Has had pelvic pain for years, is on COC Discussed could be endometriosis, wants to pursue options, declines meds till talks with Dr Charlotta Newton about all options   2. Dyspareunia in female  3. Bloating     Plan:     Return 10/04/24 to see Dr Charlotta Newton to discuss further options

## 2023-09-27 ENCOUNTER — Encounter: Payer: Self-pay | Admitting: Obstetrics & Gynecology

## 2023-09-27 ENCOUNTER — Emergency Department (HOSPITAL_COMMUNITY)
Admission: EM | Admit: 2023-09-27 | Discharge: 2023-09-27 | Discharge status: Against Medical Advice | Payer: 59 | Attending: Emergency Medicine | Admitting: Emergency Medicine

## 2023-09-27 ENCOUNTER — Ambulatory Visit (INDEPENDENT_AMBULATORY_CARE_PROVIDER_SITE_OTHER): Payer: 59 | Admitting: Obstetrics & Gynecology

## 2023-09-27 ENCOUNTER — Other Ambulatory Visit (HOSPITAL_COMMUNITY)
Admission: RE | Admit: 2023-09-27 | Discharge: 2023-09-27 | Disposition: A | Discharge status: Home / Self Care | Payer: 59 | Source: Ambulatory Visit | Attending: Obstetrics & Gynecology | Admitting: Obstetrics & Gynecology

## 2023-09-27 VITALS — BP 118/80 | Ht 65.5 in | Wt 155.0 lb

## 2023-09-27 DIAGNOSIS — R102 Pelvic and perineal pain: Secondary | ICD-10-CM

## 2023-09-27 DIAGNOSIS — Z5321 Procedure and treatment not carried out due to patient leaving prior to being seen by health care provider: Secondary | ICD-10-CM | POA: Diagnosis not present

## 2023-09-27 DIAGNOSIS — G8929 Other chronic pain: Secondary | ICD-10-CM | POA: Diagnosis present

## 2023-09-27 DIAGNOSIS — R319 Hematuria, unspecified: Secondary | ICD-10-CM | POA: Diagnosis not present

## 2023-09-27 DIAGNOSIS — Z113 Encounter for screening for infections with a predominantly sexual mode of transmission: Secondary | ICD-10-CM

## 2023-09-27 DIAGNOSIS — K5904 Chronic idiopathic constipation: Secondary | ICD-10-CM

## 2023-09-27 DIAGNOSIS — N809 Endometriosis, unspecified: Secondary | ICD-10-CM

## 2023-09-27 LAB — POCT URINALYSIS DIPSTICK OB
Glucose, UA: NEGATIVE
Ketones, UA: NEGATIVE
Leukocytes, UA: NEGATIVE
Nitrite, UA: NEGATIVE
POC,PROTEIN,UA: NEGATIVE

## 2023-09-27 MED ORDER — MYFEMBREE 40-1-0.5 MG PO TABS: 1.0000 | ORAL_TABLET | Freq: Every day | ORAL | 11 refills | Status: AC

## 2023-09-27 MED ORDER — LUBIPROSTONE 24 MCG PO CAPS: 24.0000 ug | ORAL_CAPSULE | Freq: Two times a day (BID) | ORAL | 3 refills | Status: AC

## 2023-09-27 MED ORDER — KETOROLAC TROMETHAMINE 10 MG PO TABS: 10.0000 mg | ORAL_TABLET | Freq: Three times a day (TID) | ORAL | 0 refills | Status: AC | PRN

## 2023-09-27 NOTE — Progress Notes (Signed)
Chief Complaint  Patient presents with   Worsening pelvic pain    Last 3 days      27 y.o. G0P0000 No LMP recorded. (Menstrual status: Oral contraceptives). The current method of family planning is .on continuous COC 9 years, had primary dysmenorrhea, off COC very heavy and very crampy stayed home from school  Outpatient Encounter Medications as of 09/27/2023  Medication Sig   ibuprofen (ADVIL) 200 MG tablet Take 400 mg by mouth every 6 (six) hours as needed for headache or moderate pain.   ketorolac (TORADOL) 10 MG tablet Take 1 tablet (10 mg total) by mouth every 8 (eight) hours as needed.   lubiprostone (AMITIZA) 24 MCG capsule Take 1 capsule (24 mcg total) by mouth 2 (two) times daily with a meal.   NORTREL 0.5/35, 28, 0.5-35 MG-MCG tablet Take 1 tablet by mouth daily.   Relugolix-Estradiol-Norethind (MYFEMBREE) 40-1-0.5 MG TABS Take 1 tablet by mouth daily.   DULoxetine (CYMBALTA) 60 MG capsule Take 60 mg by mouth daily. (Patient not taking: Reported on 09/27/2023)   metroNIDAZOLE (METROGEL) 0.75 % vaginal gel Place 1 Applicatorful vaginally at bedtime. (Patient not taking: Reported on 09/19/2023)   No facility-administered encounter medications on file as of 09/27/2023.    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 needs preg test in pre-op   HYMENECTOMY     TONSILLECTOMY      OB History     Gravida  0   Para  0   Term  0   Preterm  0   AB  0   Living  0      SAB  0   IAB  0   Ectopic  0   Multiple  0   Live Births  0           Allergies  Allergen Reactions   Cephalosporins Swelling    Face / tongue swelling   Iohexol Hives    Pt got small area of hives on left chest and left upper arm about 45 minutes after injection/ tsf   Lorabid [Loracarbef] Rash   Penicillins Rash    Social History   Socioeconomic History   Marital status: Single    Spouse name: Not on file   Number of children: Not on file   Years of education: Not on file   Highest education level: Not on file  Occupational History   Not on file  Tobacco Use   Smoking status: Never   Smokeless tobacco: Never  Vaping Use   Vaping status: Never Used  Substance and Sexual Activity   Alcohol use: Not Currently    Alcohol/week: 0.0 standard drinks of alcohol   Drug use: Not Currently   Sexual activity: Not Currently    Birth control/protection: Pill, Condom  Other  Topics Concern   Not on file  Social History Narrative   Not on file   Social Determinants of Health   Financial Resource Strain: Not on file  Food Insecurity: Not on file  Transportation Needs: Not on file  Physical Activity: Not on file  Stress: Not on file  Social Connections: Not on file    Family History  Problem Relation Age of Onset   Skin cancer Paternal Grandfather    Thyroid cancer Paternal Grandmother    Skin cancer Paternal Grandmother    Skin cancer Maternal Grandfather    Cholelithiasis Father        had gallbladder removed   Hypothyroidism Sister     Medications:       Current Outpatient Medications:    ibuprofen (ADVIL) 200 MG tablet, Take 400 mg by mouth every 6 (six) hours as needed for headache or moderate pain., Disp: , Rfl:    ketorolac (TORADOL) 10 MG tablet, Take 1 tablet (10 mg total) by  mouth every 8 (eight) hours as needed., Disp: 15 tablet, Rfl: 0   lubiprostone (AMITIZA) 24 MCG capsule, Take 1 capsule (24 mcg total) by mouth 2 (two) times daily with a meal., Disp: 60 capsule, Rfl: 3   NORTREL 0.5/35, 28, 0.5-35 MG-MCG tablet, Take 1 tablet by mouth daily., Disp: , Rfl:    Relugolix-Estradiol-Norethind (MYFEMBREE) 40-1-0.5 MG TABS, Take 1 tablet by mouth daily., Disp: 30 tablet, Rfl: 11   DULoxetine (CYMBALTA) 60 MG capsule, Take 60 mg by mouth daily. (Patient not taking: Reported on 09/27/2023), Disp: , Rfl:    metroNIDAZOLE (METROGEL) 0.75 % vaginal gel, Place 1 Applicatorful vaginally at bedtime. (Patient not taking: Reported on 09/19/2023), Disp: 70 g, Rfl: 0  Objective Blood pressure 118/80, height 5' 5.5" (1.664 m), weight 155 lb (70.3 kg).  General WDWN female NAD Abdomen soft scapphoid tender bilaterally no rebound Back non tender Vulva:  normal appearing vulva with no masses, tenderness or lesions Vagina:  normal mucosa, no discharge blood in vault Cervix:  Normal no lesions Uterus:  normal size, contour, position, consistency, mobility, tender, retroverted, uterosacrals tender Adnexa: ovaries:present,  normal adnexa in size, nontender and no masses tender bilaterally   Pertinent ROS As above No significant history of urinary symptoms  Labs or studies  I have reviewed labs and studies over the last couple of years under care everywhere  US PELVIC COMPLETE WITH TRANSVAGINAL  Result Date: 09/17/2023 CLINICAL DATA:  pelvic pain, bloating and pain with sex EXAM: ULTRASOUND OF PELVIS TECHNIQUE: Transabdominal and transvaginalultrasound examination of the pelvis was performed including evaluation of the uterus, ovaries, adnexal regions, and pelvic cul-de-sac. COMPARISON:  07/21/2019. FINDINGS: Uterusretroverted measuring 9 x 6 x 5 cm. The endometrium unremarkable measuring 4 mm. The uterine cavity is empty. There are no uterine masses. Right ovary Unremarkable,  2.6 x 1.4 x 1.0 cm. Left ovary Unremarkable, 2.3 x 2.5 x 1.4 cm. Images of the adnexae demonstrated no masses or fluid collections. Ovaries demonstrate blood flow with color Doppler. IMPRESSION: Unremarkable examination of the pelvis. Electronically Signed   By: Layla Maw M.D.   On: 09/17/2023 21:40       Impression + Management Plan: Diagnoses this Encounter::   ICD-10-CM   1. Chronic pelvic pain without obvious pathology  R10.2 CBC w/Diff   G89.29 Sedimentation rate    Comp Met (CMET)    Urine Culture    POC Urinalysis Dipstick OB    Cervicovaginal ancillary only( [place])   recent  sonogram 09/12/23 is normal, labs drawn today along with swab I collected, begin empiric trial of myfembree, plan to keep appointment with Dr Charlotta Newton    2. Endometriosis, suspected clinically  N80.9     3. Hematuria, unspecified type  R31.9 Urine Culture    POC Urinalysis Dipstick OB    4. Screening for STD (sexually transmitted disease)  Z11.3 Cervicovaginal ancillary only( [place])    5. Chronic idiopathic constipation  K59.04    Normal GI work up, trial [employer]     Will treat empirically with myfembree, Dr Charlotta Newton can decide if diagnostic laparoscopy is indicated now or in the future   Medications prescribed during  this encounter: Meds ordered this encounter  Medications   Relugolix-Estradiol-Norethind (MYFEMBREE) 40-1-0.5 MG TABS    Sig: Take 1 tablet by mouth daily.    Dispense:  30 tablet    Refill:  11   ketorolac (TORADOL) 10 MG tablet    Sig: Take 1 tablet (10 mg total) by mouth every 8 (eight) hours as needed.    Dispense:  15 tablet    Refill:  0   lubiprostone (AMITIZA) 24 MCG capsule    Sig: Take 1 capsule (24 mcg total) by mouth 2 (two) times daily with a meal.    Dispense:  60 capsule    Refill:  3    Labs or Scans Ordered during this encounter: Orders Placed This Encounter  Procedures   Urine Culture   CBC w/Diff   Sedimentation rate   Comp Met (CMET)   POC  Urinalysis Dipstick OB      Follow up Return in about 6 weeks (around 11/08/2023) for Follow up, with Dr Despina Hidden.

## 2023-09-27 NOTE — ED Notes (Signed)
Before RN allowed to triage patient- patient and mother state they are going to another hospital due to the waiting room.

## 2023-09-28 LAB — CERVICOVAGINAL ANCILLARY ONLY
Bacterial Vaginitis (gardnerella): NEGATIVE
Candida Glabrata: NEGATIVE
Candida Vaginitis: NEGATIVE
Chlamydia: NEGATIVE
Comment: NEGATIVE
Comment: NEGATIVE
Comment: NEGATIVE
Comment: NEGATIVE
Comment: NEGATIVE
Comment: NORMAL
Neisseria Gonorrhea: NEGATIVE
Trichomonas: NEGATIVE

## 2023-09-29 LAB — URINE CULTURE

## 2023-10-05 ENCOUNTER — Ambulatory Visit: Payer: 59 | Admitting: Obstetrics & Gynecology

## 2023-11-08 ENCOUNTER — Ambulatory Visit: Payer: 59 | Admitting: Obstetrics & Gynecology

## 2024-01-19 IMAGING — US US EXTREM LOW VENOUS*L*
1 series · 14 of 24 positions shown · non-contrast
Comparison: None.

CLINICAL DATA: Left calf pain, intermittent swelling and
tenderness.

EXAM:
LEFT LOWER EXTREMITY VENOUS DOPPLER ULTRASOUND
TECHNIQUE: Gray-scale sonography with compression, as well as color and duplex
ultrasound, were performed to evaluate the deep venous system(s)
from the level of the common femoral vein through the popliteal and
proximal calf veins.

[Series 1: us venous img lower uni left (dvt) · portal-venous · 14 of 43 slices shown]
[im 1/43]
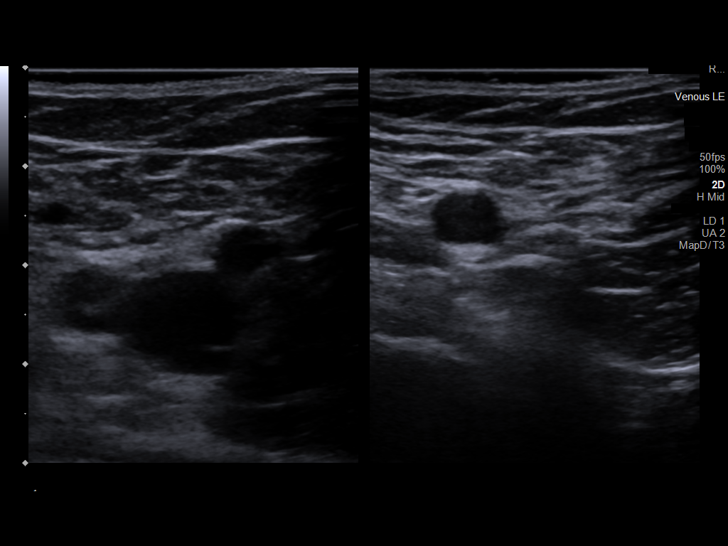
[im 4/43]
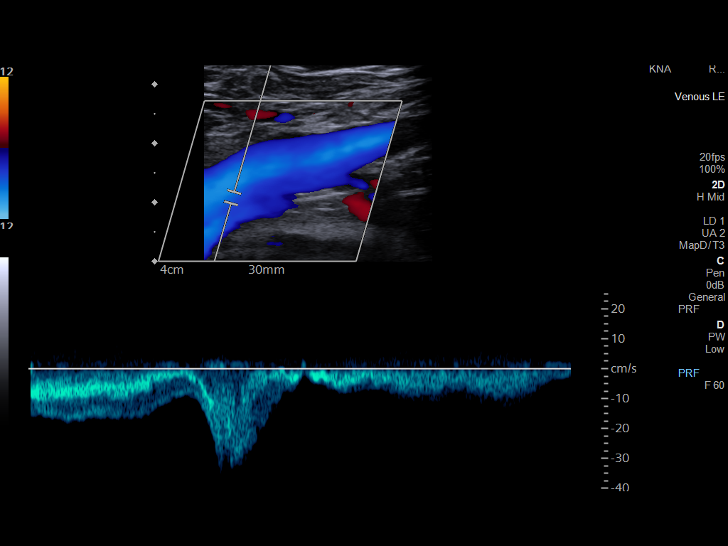
[im 8/43]
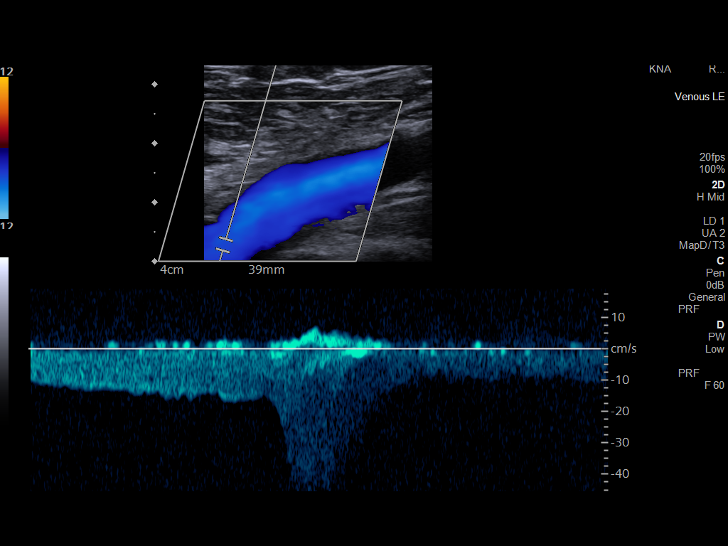
[im 11/43]
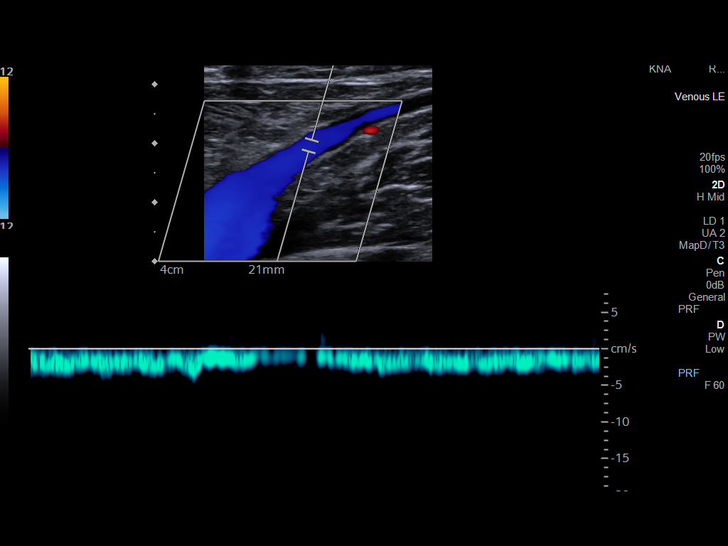
[im 13/43]
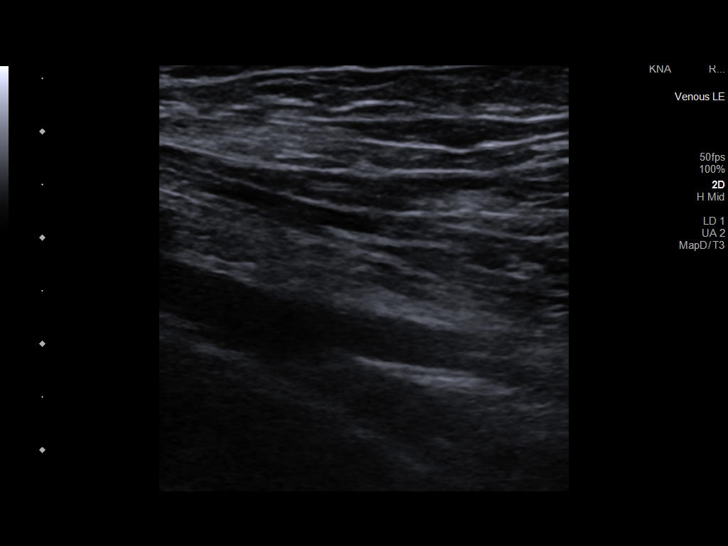
[im 17/43]
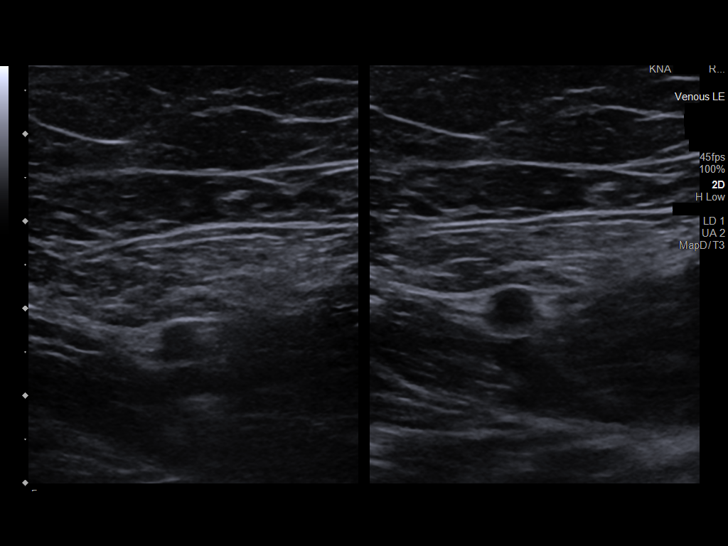
[im 21/43]
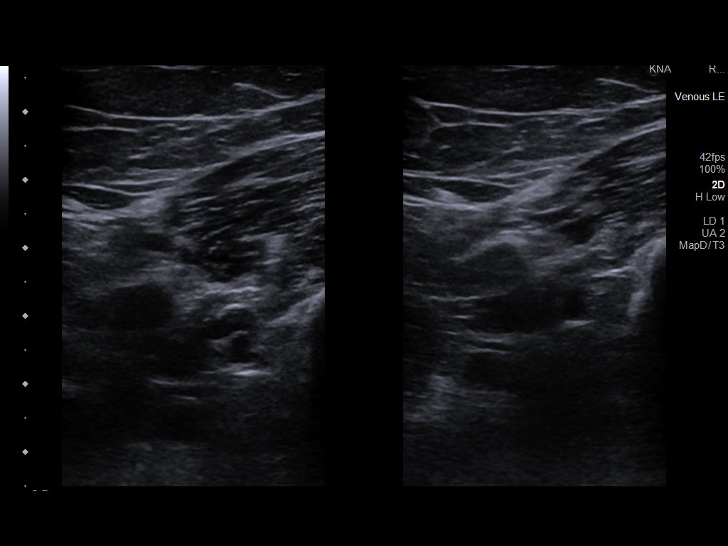
[im 22/43]
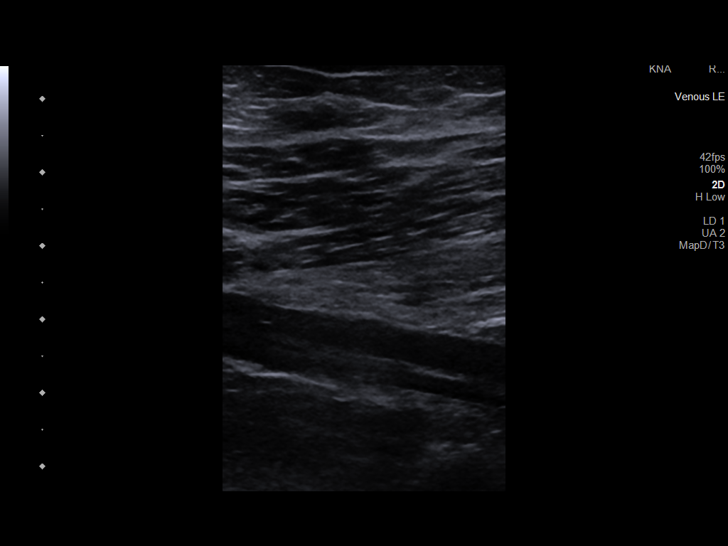
[im 26/43]
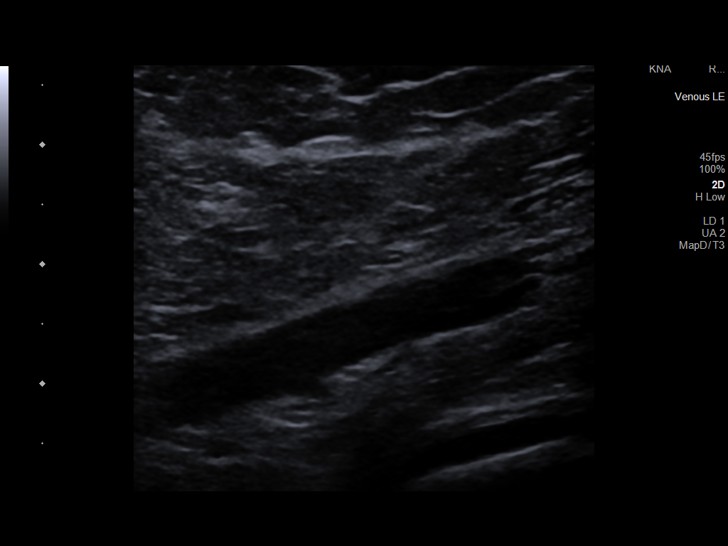
[im 30/43]
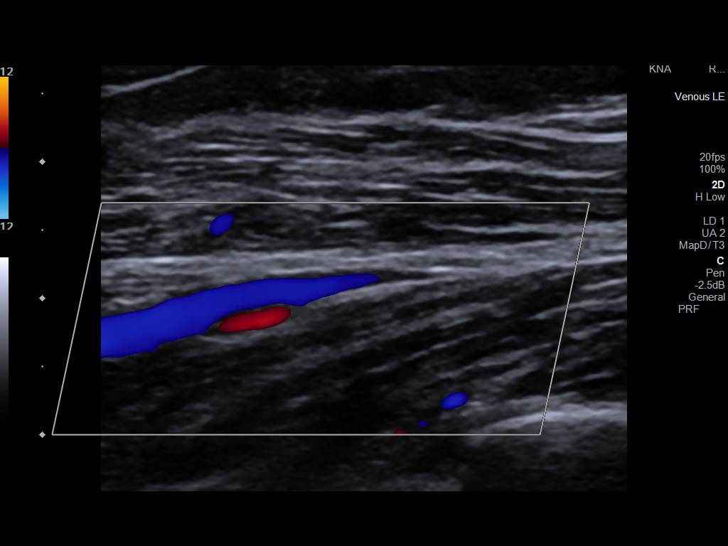
[im 33/43]
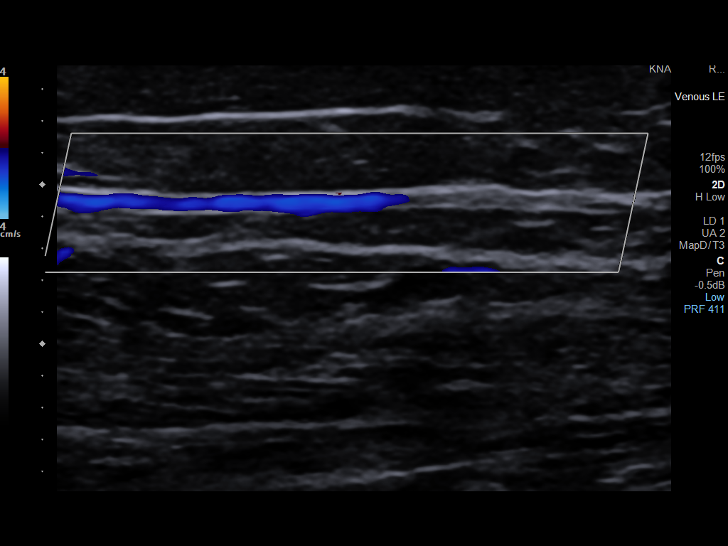
[im 35/43]
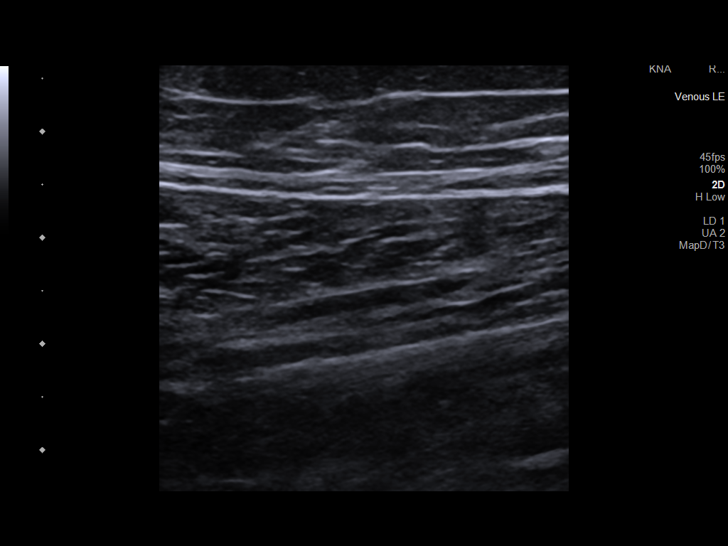
[im 39/43]
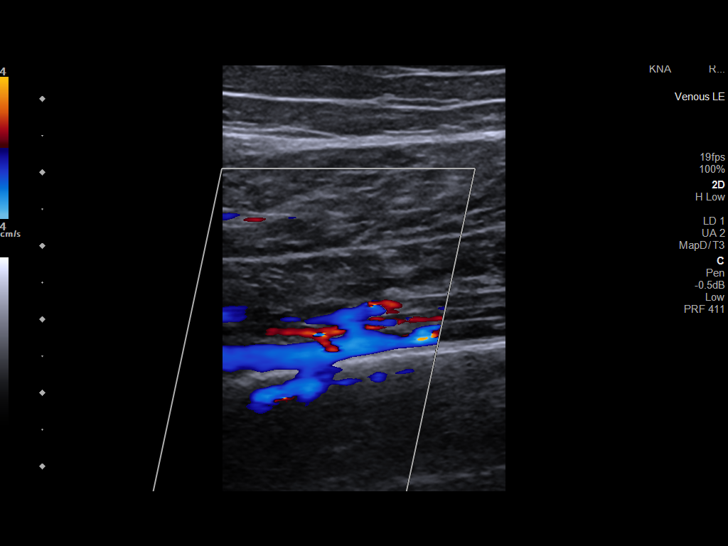
[im 43/43]
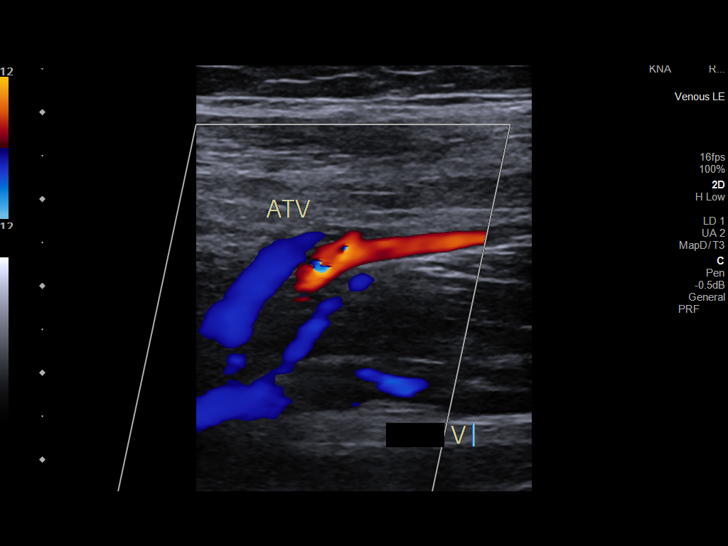

[14 of 24 positions shown; findings below may reference images not displayed]

FINDINGS: VENOUS

Normal compressibility of the common femoral, superficial femoral,
and popliteal veins, as well as the visualized calf veins.
Visualized portions of profunda femoral vein and great saphenous
vein unremarkable. No filling defects to suggest DVT on grayscale or
color Doppler imaging. Doppler waveforms show normal direction of
venous flow, normal respiratory plasticity and response to
augmentation.

Limited views of the contralateral common femoral vein are
unremarkable.

OTHER

None.

Limitations: none
IMPRESSION: Negative.

## 2024-01-23 ENCOUNTER — Encounter: Payer: 59 | Admitting: Internal Medicine
